# Patient Record
Sex: Male | Born: 2000
Health system: Southern US, Community
[De-identification: ages and names within clinical notes are randomized; demographics above are authoritative.]

## PROBLEM LIST (undated history)

## (undated) ENCOUNTER — Ambulatory Visit: Discharge status: Absent without leave | Payer: BLUE CROSS/BLUE SHIELD

## (undated) DIAGNOSIS — F845 Asperger's syndrome: Secondary | ICD-10-CM

---
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
## 2020-01-06 ENCOUNTER — Encounter: Payer: Self-pay | Admitting: Emergency Medicine

## 2020-01-06 ENCOUNTER — Ambulatory Visit
Admission: EM | Admit: 2020-01-06 | Discharge: 2020-01-06 | Disposition: A | Discharge status: Home / Self Care | Payer: BLUE CROSS/BLUE SHIELD | Attending: Emergency Medicine | Admitting: Emergency Medicine

## 2020-01-06 ENCOUNTER — Other Ambulatory Visit: Payer: Self-pay

## 2020-01-06 DIAGNOSIS — J029 Acute pharyngitis, unspecified: Secondary | ICD-10-CM

## 2020-01-06 LAB — POCT RAPID STREP A (OFFICE): Rapid Strep A Screen: NEGATIVE

## 2020-01-06 MED ORDER — LIDOCAINE VISCOUS HCL 2 % MT SOLN: 10.0000 mL | OROMUCOSAL | 1 refills | Status: DC | PRN

## 2020-01-06 MED ORDER — PREDNISONE 10 MG PO TABS: 20.0000 mg | ORAL_TABLET | Freq: Every day | ORAL | 0 refills | Status: AC

## 2020-01-06 NOTE — ED Provider Notes (Signed)
[facility]   [id number] 01/06/20 Arrival Time: 1245  ZS:WFUX THROAT  SUBJECTIVE: History from: patient.  Abdallah Hern is a 19 y.o. male who presents with abrupt onset of sore throat for 2 days.  Denies sick exposure to strep, flu or mono, or precipitating event.  Reporting recent COVID-19 infection 1 month ago.  Has tried OTC medication without relief.  Symptoms are made worse with swallowing, but tolerating liquids and own secretions without difficulty.  Reports/ denies previous symptoms in the past.  Denies fever, chills, fatigue, ear pain, sinus pain, rhinorrhea, nasal congestion, cough, SOB, wheezing, chest pain, nausea, rash, changes in bowel or bladder habits.    ROS: As per HPI.  All other pertinent ROS negative.       Past Medical History:  Diagnosis Date   Aspergers' syndrome    History reviewed. No pertinent surgical history. Allergies  Allergen Reactions   Poison Ivy Extract [Poison Ivy Extract]    Poison Oak Extract [Poison Oak Extract]    Other     Seasonal Allergies   No current facility-administered medications on file prior to encounter.   Current Outpatient Medications on File Prior to Encounter  Medication Sig Dispense Refill   cetirizine-pseudoephedrine (ZYRTEC-D) 5-120 MG tablet Take 1 tablet by mouth daily. 30 tablet 0   [DISCONTINUED] albuterol (PROAIR HFA) 108 (90 Base) MCG/ACT inhaler Inhale 1-2 puffs into the lungs every 4 (four) hours as needed for wheezing or shortness of breath. 18 g 0   Social History   Socioeconomic History   Marital status: Single    Spouse name: Not on file   Number of children: Not on file   Years of education: Not on file   Highest education level: Not on file  Occupational History   Not on file  Tobacco Use   Smoking status: Former Smoker    Types: Cigarettes   Smokeless tobacco: Never Used   Tobacco comment: Quit 3 weeks ago  Vaping Use   Vaping Use: Never used  Substance and Sexual  Activity   Alcohol use: No   Drug use: No   Sexual activity: Never  Other Topics Concern   Not on file  Social History Narrative   Not on file   Social Determinants of Health   Financial Resource Strain:    Difficulty of Paying Living Expenses: Not on file  Food Insecurity:    Worried About Programme researcher, broadcasting/film/video in the Last Year: Not on file   [employer] of Food in the Last Year: Not on file  Transportation Needs:    Lack of Transportation (Medical): Not on file   Lack of Transportation (Non-Medical): Not on file  Physical Activity:    Days of Exercise per Week: Not on file   Minutes of Exercise per Session: Not on file  Stress:    Feeling of Stress : Not on file  Social Connections:    Frequency of Communication with Friends and Family: Not on file   Frequency of Social Gatherings with Friends and Family: Not on file   Attends Religious Services: Not on file   Active Member of Clubs or Organizations: Not on file   Attends Banker Meetings: Not on file   Marital Status: Not on file  Intimate Partner Violence:    Fear of Current or Ex-Partner: Not on file   Emotionally Abused: Not on file   Physically Abused: Not on file   Sexually Abused: Not on file   Family  History  Problem Relation Age of Onset   Autism spectrum disorder Cousin    Mental retardation Cousin    Seizures Cousin     OBJECTIVE:  Vitals:   01/06/20 1258  Weight: 280 lb (127 kg)  Height: 6\' 4"  (1.93 m)     General appearance: alert; appears fatigued, but nontoxic, speaking in full sentences and managing own secretions HEENT: NCAT; Ears: EACs clear, TMs pearly gray with visible cone of light, without erythema; Eyes: PERRL, EOMI grossly; Nose: no obvious rhinorrhea; Throat: oropharynx clear, tonsils 1+ and mildly erythematous without white tonsillar exudates, uvula midline Neck: supple without LAD Lungs: CTA bilaterally without adventitious breath sounds; cough  absent Heart: regular rate and rhythm.  Radial pulses 2+ symmetrical bilaterally Skin: warm and dry Psychological: alert and cooperative; normal mood and affect  LABS: Results for orders placed or performed during the hospital encounter of 01/06/20 (from the past 24 hour(s))  POCT rapid strep A     Status: None   Collection Time: 01/06/20  1:09 PM  Result Value Ref Range   Rapid Strep A Screen Negative Negative     ASSESSMENT & PLAN:  1. Sore throat     Meds ordered this encounter  Medications   lidocaine (XYLOCAINE) 2 % solution    Sig: Use as directed 10 mLs in the mouth or throat as needed for mouth pain.    Dispense:  100 mL    Refill:  1    Discharge instructions  Strep test negative, will send out for culture and we will call you with results Get plenty of rest and push fluids Viscous lidocaine prescribed.  This is an oral solution you can swish, and gargle as needed for symptomatic relief of sore throat.  Do not exceed 8 doses in a 24 hour period.  Do not use prior to eating, as this will numb your entire mouth. Use throat lozenges such as Cepacol, Halls or Vicks lozenges to soothe throat Drink warm or cool liquids, use throat lozenges, or popsicles to help alleviate symptoms Take OTC ibuprofen or tylenol as needed for pain Follow up with PCP if symptoms persists Return or go to ER if patient has any new or worsening symptoms such as fever, chills, nausea, vomiting, worsening sore throat, cough, abdominal pain, chest pain, changes in bowel or bladder habits, etc...  Reviewed expectations re: course of current medical issues. Questions answered. Outlined signs and symptoms indicating need for more acute intervention. Patient verbalized understanding. After Visit Summary given.      t   01/08/20, FNP 01/06/20 1336

## 2020-01-06 NOTE — ED Triage Notes (Signed)
Sore throat x 3 days

## 2020-01-06 NOTE — Discharge Instructions (Addendum)
Strep test negative, will send out for culture and we will call you with results Get plenty of rest and push fluids Viscous lidocaine prescribed.  This is an oral solution you can swish, and gargle as needed for symptomatic relief of sore throat.  Do not exceed 8 doses in a 24 hour period.  Do not use prior to eating, as this will numb your entire mouth. Use throat lozenges such as Cepacol, Halls or Vicks lozenges to soothe throat Drink warm or cool liquids, use throat lozenges, or popsicles to help alleviate symptoms Take OTC ibuprofen or tylenol as needed for pain Follow up with PCP if symptoms persists Return or go to ER if patient has any new or worsening symptoms such as fever, chills, nausea, vomiting, worsening sore throat, cough, abdominal pain, chest pain, changes in bowel or bladder habits, etc..Marland Kitchen

## 2020-01-08 LAB — CULTURE, GROUP A STREP (THRC)

## 2020-01-09 LAB — CULTURE, GROUP A STREP (THRC)

## 2020-02-11 ENCOUNTER — Ambulatory Visit
Admission: EM | Admit: 2020-02-11 | Discharge: 2020-02-11 | Disposition: A | Discharge status: Home / Self Care | Payer: 59 | Attending: Emergency Medicine | Admitting: Emergency Medicine

## 2020-02-11 ENCOUNTER — Other Ambulatory Visit: Payer: Self-pay

## 2020-02-11 DIAGNOSIS — J029 Acute pharyngitis, unspecified: Secondary | ICD-10-CM | POA: Diagnosis present

## 2020-02-11 LAB — POCT RAPID STREP A (OFFICE): Rapid Strep A Screen: NEGATIVE

## 2020-02-11 LAB — POCT MONO SCREEN (KUC): Mono, POC: NEGATIVE

## 2020-02-11 MED ORDER — IBUPROFEN 600 MG PO TABS: 600.0000 mg | ORAL_TABLET | Freq: Four times a day (QID) | ORAL | 0 refills | Status: DC | PRN

## 2020-02-11 NOTE — ED Provider Notes (Signed)
HPI  SUBJECTIVE:  Patient reports sore throat starting 1 week ago. Sx worse with drinking sodas.  Sx better with nothing. Has been taking hot tea and Zyrtec D w/ o relief.  No fever   No neck stiffness  + Cough with some wheezing after coughing only + nasal congestion, rhinorrhea, postnasal drip + Myalgias No Headache No Rash  + Reports decreased  taste and smell No shortness of breath or difficulty breathing No nausea, vomiting No diarrhea No abdominal pain     No Recent Strep, mono, COVID, flu exposure + reflux sxs-burning chest pain no belching, water brash. No Allergy sxs  No Breathing difficulty, voice changes, sensation of throat swelling shut No Drooling No Trismus No abx in past month.  No antipyretic in past 4-6 hrs  Past medical history of Covid in September, Asperger's syndrome.  No history of asthma, GERD, allergies, frequent strep, mono. HRC:BULAGT, Ricardo Picket, Ricardo Lin    Past Medical History:  Diagnosis Date  . Aspergers' syndrome     History reviewed. No pertinent surgical history.  Family History  Problem Relation Age of Onset  . Autism spectrum disorder Cousin   . Mental retardation Cousin   . Seizures Cousin     Social History   Tobacco Use  . Smoking status: Former Smoker    Types: Cigarettes  . Smokeless tobacco: Never Used  . Tobacco comment: Quit 3 weeks ago  Vaping Use  . Vaping Use: Never used  Substance Use Topics  . Alcohol use: No  . Drug use: No    No current facility-administered medications for this encounter.  Current Outpatient Medications:  .  ibuprofen (ADVIL) 600 MG tablet, Take 1 tablet (600 mg total) by mouth every 6 (six) hours as needed., Disp: 30 tablet, Rfl: 0  Allergies  Allergen Reactions  . Poison Ivy Extract [Poison Ivy Extract]   . Poison Oak Extract [employer] Extract]   . Other     Seasonal Allergies     ROS  As noted in HPI.   Physical Exam  BP (!) 161/85 (BP Location: Right Arm)   Pulse  85   Temp 98.4 F (36.9 C) (Oral)   Resp 14   SpO2 96%   Constitutional: Well developed, well nourished, no acute distress Eyes:  EOMI, conjunctiva normal bilaterally HENT: Normocephalic, atraumatic,mucus membranes moist. + nasal congestion no sinus tenderness.  Erythematous oropharynx with enlarged tonsils.  Positive exudates.  Uvula midline.  Nares postnasal drip.  Respiratory: Normal inspiratory effort, lungs clear bilaterally Cardiovascular: Normal rate, no murmurs, rubs, gallops GI: nondistended, nontender. No appreciable splenomegaly skin: No rash, skin intact Lymph: + Large anterior cervical LN.  No posterior cervical lymphadenopathy Musculoskeletal: no deformities Neurologic: Alert & oriented x 3, no focal neuro deficits Psychiatric: Speech and behavior appropriate.   ED Course   Medications - No data to display  Orders Placed This Encounter  Procedures  . Culture, group A strep    Standing Status:   Standing    Number of Occurrences:   1  . POCT mono screen    Standing Status:   Standing    Number of Occurrences:   1  . POCT rapid strep A    Standing Status:   Standing    Number of Occurrences:   1    Results for orders placed or performed during the hospital encounter of 02/11/20 (from the past 24 hour(s))  POCT mono screen     Status: None   Collection  Time: 02/11/20  6:49 PM  Result Value Ref Range   Mono, POC Negative Negative  POCT rapid strep A     Status: None   Collection Time: 02/11/20  6:49 PM  Result Value Ref Range   Rapid Strep A Screen Negative Negative   No results found.  ED Clinical Impression  1. Exudative pharyngitis      ED Assessment/Plan   Patient with an exudative pharyngitis.  Rapid strep, mono negative. Obtaining throat culture to guide antibiotic treatment. Discussed this with patient. We'll contact him if culture is positive, and will call in Appropriate antibiotics. Patient home with ibuprofen, Tylenol, Benadryl/Maalox  mixture.. Patient to followup with PMD when necessary.  The ER if he gets worse.  Covid not done as he had it 2 months ago.  Discussed labs,  MDM, plan and followup with patient. Discussed sn/sx that should prompt return to the ED. patient agrees with plan.   Meds ordered this encounter  Medications  . ibuprofen (ADVIL) 600 MG tablet    Sig: Take 1 tablet (600 mg total) by mouth every 6 (six) hours as needed.    Dispense:  30 tablet    Refill:  0     *This clinic note was created using Scientist, clinical (histocompatibility and immunogenetics). Therefore, there may be occasional mistakes despite careful proofreading.     Domenick Gong, MD 02/12/20 [phone]

## 2020-02-11 NOTE — ED Triage Notes (Signed)
Pt presents with cough for past few days, had covid in September

## 2020-02-11 NOTE — Discharge Instructions (Addendum)
your mono and rapid strep were negative today, so we have sent off a throat culture.  We will contact you and call in the appropriate antibiotics if your culture comes back positive for an infection requiring antibiotic treatment.  Give [place] a working phone number.  1 gram of Tylenol and 600 mg ibuprofen together 3-4 times a day as needed for pain.  Make sure you drink plenty of extra fluids.  Some people find salt water gargles and  Traditional Medicinal's "Throat Coat" tea helpful. Take 5 mL of liquid Benadryl and 5 mL of Maalox. Mix it together, and then hold it in your mouth for as long as you can and then swallow. You may do this 4 times a day.    Go to www.goodrx.com to look up your medications. This will give you a list of where you can find your prescriptions at the most affordable prices. Or ask the pharmacist what the cash price is, or if they have any other discount programs available to help make your medication more affordable. This can be less expensive than what you would pay with insurance.

## 2020-02-13 LAB — CULTURE, GROUP A STREP (THRC)

## 2020-02-14 LAB — CULTURE, GROUP A STREP (THRC)

## 2020-12-29 ENCOUNTER — Other Ambulatory Visit: Payer: Self-pay

## 2020-12-29 ENCOUNTER — Encounter (HOSPITAL_COMMUNITY): Payer: Self-pay | Admitting: *Deleted

## 2020-12-29 ENCOUNTER — Emergency Department (HOSPITAL_COMMUNITY): Payer: 59

## 2020-12-29 ENCOUNTER — Emergency Department (HOSPITAL_COMMUNITY)
Admission: EM | Admit: 2020-12-29 | Discharge: 2020-12-29 | Disposition: A | Discharge status: Home / Self Care | Payer: 59 | Attending: Emergency Medicine | Admitting: Emergency Medicine

## 2020-12-29 DIAGNOSIS — S6991XA Unspecified injury of right wrist, hand and finger(s), initial encounter: Secondary | ICD-10-CM | POA: Diagnosis present

## 2020-12-29 DIAGNOSIS — S62339A Displaced fracture of neck of unspecified metacarpal bone, initial encounter for closed fracture: Secondary | ICD-10-CM

## 2020-12-29 DIAGNOSIS — S62326A Displaced fracture of shaft of fifth metacarpal bone, right hand, initial encounter for closed fracture: Secondary | ICD-10-CM | POA: Insufficient documentation

## 2020-12-29 DIAGNOSIS — W2201XA Walked into wall, initial encounter: Secondary | ICD-10-CM | POA: Insufficient documentation

## 2020-12-29 DIAGNOSIS — M25541 Pain in joints of right hand: Secondary | ICD-10-CM | POA: Insufficient documentation

## 2020-12-29 MED ORDER — IBUPROFEN 800 MG PO TABS: 800.0000 mg | ORAL_TABLET | Freq: Three times a day (TID) | ORAL | 0 refills | Status: AC

## 2020-12-29 NOTE — ED Triage Notes (Signed)
Hit a wall, pain in right hand

## 2020-12-29 NOTE — Discharge Instructions (Addendum)
Contact a health care provider if: Your pain is getting worse. You have more redness, swelling, or pain in the injured area. You have a fever. Your cast or splint feels too tight or too loose. Your cast is coming apart or breaking. You develop a rash. Get help right away if: You have severe pain. Your skin or nails on your injured hand turn blue or gray even after you loosen your splint. Your injured hand feels cold or becomes numb even after you loosen your splint.

## 2020-12-29 NOTE — ED Provider Notes (Signed)
[facility] EMERGENCY DEPARTMENT Provider Note   CSN: [id number] Arrival date & time: 12/29/20  1341     History No chief complaint on file.   Ricardo Lin is a 20 y.o. male.  The history is provided by the patient. No language interpreter was used.  Hand Injury Location:  Hand Hand location:  R hand Injury: yes   Time since incident:  4 hours Mechanism of injury comment:  Punched a wall Pain details:    Quality:  Aching and throbbing   Radiates to:  Does not radiate   Severity:  Moderate   Onset quality:  Sudden   Timing:  Constant   Progression:  Unchanged Handedness:  Right-handed Dislocation: no   Prior injury to area:  Yes Relieved by:  None tried Worsened by:  Movement Ineffective treatments:  None tried Associated symptoms: decreased range of motion and stiffness   Associated symptoms: no back pain, no fatigue, no fever, no muscle weakness, no neck pain and no numbness       Past Medical History:  Diagnosis Date   Aspergers' syndrome     Patient Active Problem List   Diagnosis Date Noted   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 No pertinent surgical history.     Family History  Problem Relation Age of Onset   Autism spectrum disorder Cousin    Mental retardation Cousin    Seizures Cousin     Social History   Tobacco Use   Smoking status: Former    Types: Cigarettes   Smokeless tobacco: Never   Tobacco comments:    Quit 3 weeks ago  Vaping Use   Vaping Use: Never used  Substance Use Topics   Alcohol use: No   Drug use: No    Home Medications Prior to Admission medications   Medication Sig Start Date End Date Taking? Authorizing Provider  ibuprofen (ADVIL) 600 MG tablet Take 1 tablet (600 mg total) by mouth every 6 (six) hours as needed. 02/11/20   Domenick Gong, MD  albuterol [facility] HFA) 108 [phone] Base) MCG/ACT  inhaler Inhale 1-2 puffs into the lungs every 4 (four) hours as needed for wheezing or shortness of breath. 12/13/18 11/28/19  [place], [place], PA-C    Allergies    Poison ivy extract [poison ivy extract], Poison oak extract [poison oak extract], and Other  Review of Systems   Review of Systems  Constitutional:  Negative for fatigue and fever.  Musculoskeletal:  Positive for joint swelling and stiffness. Negative for back pain and neck pain.  Skin:  Negative for wound.   Physical Exam Updated Vital Signs BP (!) 144/86 (BP Location: Left Arm)   Pulse 86   Temp 98.1 F (36.7 C) (Oral)   Resp 20   Ht 6\' 4"  (1.93 m)   Wt 124 kg   SpO2 99%   BMI 33.28 kg/m   Physical Exam Vitals and nursing note reviewed.  Constitutional:      General: He is not in acute distress.    Appearance: He is well-developed. He is not diaphoretic.  HENT:     Head: Normocephalic and atraumatic.  Eyes:     General: No scleral icterus.    Conjunctiva/sclera: Conjunctivae normal.  Cardiovascular:     Rate and Rhythm: Normal rate and regular rhythm.     Heart sounds: Normal heart sounds.  Pulmonary:  Effort: Pulmonary effort is normal. No respiratory distress.     Breath sounds: Normal breath sounds.  Abdominal:     Palpations: Abdomen is soft.     Tenderness: There is no abdominal tenderness.  Musculoskeletal:     Cervical back: Normal range of motion and neck supple.     Comments: R hand with abnormal angulation of the right pinky, there is bruising across the dorsum of the right hand over the knuckles of the third fourth and fifth digits.  Significant swelling.  DP pulse 2+, normal capillary refill less than 2 seconds with normal sensation.  Range of motion limited due to pain and swelling  Skin:    General: Skin is warm and dry.  Neurological:     Mental Status: He is alert.  Psychiatric:        Behavior: Behavior normal.    ED Results / Procedures / Treatments   Labs (all labs ordered are  listed, but only abnormal results are displayed) Labs Reviewed - No data to display  EKG None  Radiology DG Hand Complete Right  Result Date: 12/29/2020 CLINICAL DATA:  Patient got mad and hit a wall. Swelling and bruising noted to the right hand. EXAM: RIGHT HAND - COMPLETE 3+ VIEW COMPARISON:  None. FINDINGS: There is a mildly displaced and angulated fracture of the mid shaft of the fifth metacarpal. No additional acute fracture identified. No evidence of dislocation. There is regional soft tissue swelling. IMPRESSION: Mildly displaced and angulated fracture of the mid shaft of the right fifth metacarpal. Electronically Signed   By: Emmaline Kluver M.D.   On: 12/29/2020 14:13    Procedures Procedures   Medications Ordered in ED Medications - No data to display  ED Course  I have reviewed the triage vital signs and the nursing notes.  Pertinent labs & imaging results that were available during my care of the patient were reviewed by me and considered in my medical decision making (see chart for details).    MDM Rules/Calculators/A&P                            Patient with boxer's fracture of the right fifth digit. No obvious neurovascular injury.  Patient placed in ulnar gutter splint and sling.  Given light duty at work.  Follow-up with hand specialist.  Discussed outpatient follow-up and return precautions. Final Clinical Impression(s) / ED Diagnoses Final diagnoses:  None    Rx / DC Orders ED Discharge Orders     None        Arthor Captain, PA-C 12/29/20 1711    Pricilla Loveless, MD 12/30/20 1555

## 2020-12-30 ENCOUNTER — Telehealth: Payer: Self-pay | Admitting: Radiology

## 2020-12-30 NOTE — Telephone Encounter (Signed)
Can you please review and advise on this. He was seen at [facility] on 12/28/20.

## 2020-12-31 ENCOUNTER — Ambulatory Visit (INDEPENDENT_AMBULATORY_CARE_PROVIDER_SITE_OTHER): Payer: BC Managed Care – PPO | Admitting: Orthopedic Surgery

## 2020-12-31 VITALS — BP 132/73 | HR 74 | Ht 76.0 in | Wt 273.0 lb

## 2020-12-31 DIAGNOSIS — S62327A Displaced fracture of shaft of fifth metacarpal bone, left hand, initial encounter for closed fracture: Secondary | ICD-10-CM | POA: Diagnosis not present

## 2020-12-31 NOTE — Progress Notes (Signed)
Office Visit Note   Patient: Ricardo Lin           Date of Birth: Sep 10, 2000           MRN: [id number] Visit Date: 12/31/2020              Requested by: Joaquin Courts, DO [address] [place],  [place] [address] PCP: Joaquin Courts, DO   Assessment & Plan: Visit Diagnoses:  1. Displaced fracture of shaft of fifth metacarpal bone, left hand, initial encounter for closed fracture     Plan: Discussed with patient that he has evidence of multiple previous metacarpal fractures.  He notes that he has had at least 2 other fifth metacarpal fractures in his life.  He notes that the pseudoclawing that is present has been present since the last fracture.  This has not been functionally limiting for him.  He has no evidence of malrotation and has a normal digital cascade when making a full fist.  Given his history of multiple prior fractures for punching walls, we will treat this nonoperatively with a cast.  He agrees with this treatment plan.  Follow-Up Instructions: No follow-ups on file.   Orders:  No orders of the defined types were placed in this encounter.  No orders of the defined types were placed in this encounter.     Procedures: Casting  Date/Time: 12/31/2020 3:27 PM Performed by: Marlyne Beards, MD Authorized by: Marlyne Beards, MD   Consent Given by:  Patient Location:  Hand  right hand Fracture Type: fifth metacarpal   Neurovascularly intact   Distal Perfusion: normal   Distal Sensation: normal   Manipulation Performed?: No   Immobilization:  Cast Is this the patient's first cast for this injury?: Yes   Cast Type:  Ulnar gutter Supplies Used:  Fiberglass and cotton padding Neurovascularly intact   Distal Perfusion: normal   Distal Sensation: normal   Patient tolerance:  Patient tolerated the procedure well with no immediate complications   Clinical Data: No additional findings.   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  He notes that he was angry and punched a wall on Sunday.  He was seen in the [facility], ER where he was placed in an ulnar gutter splint.  His pain is well controlled with ibuprofen intermittently.  He denies any numbness or paresthesias.  He notes that he has had several fractures of this fifth metacarpal and has punched solid objects with some frequency.   Review of Systems   Objective: Vital Signs: BP 132/73 (BP Location: Left Arm, Patient Position: Sitting)   Pulse 74   Ht 6\' 4"  (1.93 m)   Wt 273 lb (123.8 kg)   BMI 33.23 kg/m   Physical Exam Constitutional:      Appearance: Normal appearance.  Cardiovascular:     Rate and Rhythm: Normal rate.     Pulses: Normal pulses.  Skin:    General: Skin is warm and dry.     Capillary Refill: Capillary refill takes less than 2 seconds.  Neurological:     Mental Status: He is alert.    Right Hand Exam   Tenderness  Right hand tenderness location: Mildly  TTP along 5th MC shaft.  Other  Sensation: normal Pulse: present  Comments:  Mild ulnar dorsal swelling and ecchymosis.  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  No malrotation with making a full fist.       Specialty Comments:  No specialty comments available.  Imaging: 3 views of the right hand taken in the ER reviewed by me today.  They demonstrate a transverse fracture through the fifth metacarpal shaft in the setting of multiple priors of the fifth metacarpal neck with volar bowing of the metacarpal.   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 Social History   Occupational History   Not on file  Tobacco Use   Smoking status: Former    Types: Cigarettes   Smokeless tobacco: Never   Tobacco comments:    Quit 3 weeks ago  Vaping Use   Vaping Use: Never used  Substance and Sexual Activity   Alcohol use: No   Drug use: No   Sexual activity: Never

## 2021-01-27 ENCOUNTER — Ambulatory Visit: Payer: 59 | Admitting: Orthopedic Surgery

## 2021-01-28 ENCOUNTER — Telehealth: Payer: Self-pay

## 2021-01-28 NOTE — Telephone Encounter (Signed)
Patient showed up at the office stating he needed a note to return to work. 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 I advised per last OV he was working with restrictions. I advised patient that we could discuss if this was ok with Dr Frazier Butt and could create a new work note once advised by him. He would like someone to call him once this has been done at [phone]-he states this is his moms number.

## 2021-01-31 ENCOUNTER — Ambulatory Visit (INDEPENDENT_AMBULATORY_CARE_PROVIDER_SITE_OTHER): Payer: 59

## 2021-01-31 ENCOUNTER — Other Ambulatory Visit: Payer: Self-pay

## 2021-01-31 ENCOUNTER — Ambulatory Visit (INDEPENDENT_AMBULATORY_CARE_PROVIDER_SITE_OTHER): Payer: 59 | Admitting: Orthopedic Surgery

## 2021-01-31 DIAGNOSIS — S62327A Displaced fracture of shaft of fifth metacarpal bone, left hand, initial encounter for closed fracture: Secondary | ICD-10-CM

## 2021-01-31 NOTE — Progress Notes (Signed)
Office Visit Note   Patient: Ricardo Lin           Date of Birth: August 15, 2000           MRN: [id number] Visit Date: 01/31/2021              Requested by: Joaquin Courts, DO [address] [place],  [place] [address] PCP: Joaquin Courts, DO   Assessment & Plan: Visit Diagnoses:  1. Displaced fracture of shaft of fifth metacarpal bone, left hand, initial encounter for closed fracture     Plan: Patient's fracture is healing on x-ray with abundant callus formation. He notes that he removed the cast himself within the first two weeks.  He has no pain at the fracture site.  He is able to make a complete fist without pain or malrotation.  He can return to work as tolerated.   Follow-Up Instructions: No follow-ups on file.   Orders:  Orders Placed This Encounter  Procedures   XR Hand Complete Left   No orders of the defined types were placed in this encounter.     Procedures: No procedures performed   Clinical Data: No additional findings.   Subjective: Chief Complaint  Patient presents with   Left Hand - Follow-up    5th Metacarpal fx    This is a 20 yo RHD M who presents for follow up of a 5th MC fracture.  This is his third fracture of this metacarpal with residual deformity from his previous two fractures.  He removed his own cast before the two week point.  He has no pain today.  He has been using the hand without difficulty.  He has no pain.    Review of Systems   Objective: Vital Signs: There were no vitals taken for this visit.  Physical Exam Constitutional:      Appearance: Normal appearance.  Cardiovascular:     Rate and Rhythm: Normal rate.     Pulses: Normal pulses.  Pulmonary:     Effort: Pulmonary effort is normal.  Skin:    General: Skin is warm and dry.     Capillary Refill: Capillary refill takes less than 2 seconds.  Neurological:     Mental Status: He is alert.    Right Hand Exam   Tenderness  The patient is experiencing  no tenderness.   Range of Motion  The patient has normal right wrist ROM.   Other  Erythema: absent Sensation: normal Pulse: present  Comments:  Dorsal bony prominence that is not painful.  Full and painless ROM with no malrotation.  Mild pseudoclawing that was present from his previous two fractures.      Specialty Comments:  No specialty comments available.  Imaging: 3V of the right hand taken today are reviewed and interpreted by me.  They demonstrate bony healing of the metacarpal fracture with abundant callus.  Bone is bowed from his previous fractures.    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 Social History   Occupational History   Not on file  Tobacco Use   Smoking status: Former    Types: Cigarettes   Smokeless tobacco: Never   Tobacco comments:    Quit 3 weeks ago  Vaping Use   Vaping Use: Never used  Substance and Sexual Activity   Alcohol use: No   Drug use: No   Sexual activity: Never
